# Patient Record
Sex: Female | Born: 1965 | Race: Black or African American | Hispanic: No | Marital: Married | Smoking: Never smoker
Health system: Southern US, Community
[De-identification: ages and names within clinical notes are randomized; demographics above are authoritative.]

## PROBLEM LIST (undated history)

## (undated) DIAGNOSIS — M1990 Unspecified osteoarthritis, unspecified site: Secondary | ICD-10-CM

## (undated) DIAGNOSIS — J45909 Unspecified asthma, uncomplicated: Secondary | ICD-10-CM

## (undated) DIAGNOSIS — E119 Type 2 diabetes mellitus without complications: Secondary | ICD-10-CM

## (undated) DIAGNOSIS — F319 Bipolar disorder, unspecified: Secondary | ICD-10-CM

## (undated) HISTORY — PX: HAND SURGERY: SHX662

## (undated) HISTORY — PX: COMBINED HYSTEROSCOPY DIAGNOSTIC / D&C: SUR297

## (undated) HISTORY — PX: ABDOMINAL HYSTERECTOMY: SHX81

---

## 2012-03-14 ENCOUNTER — Encounter (HOSPITAL_BASED_OUTPATIENT_CLINIC_OR_DEPARTMENT_OTHER): Payer: Self-pay

## 2012-03-14 ENCOUNTER — Emergency Department (HOSPITAL_BASED_OUTPATIENT_CLINIC_OR_DEPARTMENT_OTHER): Payer: Medicare Other

## 2012-03-14 ENCOUNTER — Emergency Department (HOSPITAL_BASED_OUTPATIENT_CLINIC_OR_DEPARTMENT_OTHER)
Admission: EM | Admit: 2012-03-14 | Discharge: 2012-03-14 | Disposition: A | Discharge status: Home / Self Care | Payer: Medicare Other | Attending: Emergency Medicine | Admitting: Emergency Medicine

## 2012-03-14 DIAGNOSIS — R51 Headache: Secondary | ICD-10-CM | POA: Insufficient documentation

## 2012-03-14 DIAGNOSIS — F319 Bipolar disorder, unspecified: Secondary | ICD-10-CM | POA: Insufficient documentation

## 2012-03-14 DIAGNOSIS — M129 Arthropathy, unspecified: Secondary | ICD-10-CM | POA: Insufficient documentation

## 2012-03-14 DIAGNOSIS — Z79899 Other long term (current) drug therapy: Secondary | ICD-10-CM | POA: Insufficient documentation

## 2012-03-14 DIAGNOSIS — E119 Type 2 diabetes mellitus without complications: Secondary | ICD-10-CM | POA: Insufficient documentation

## 2012-03-14 DIAGNOSIS — J329 Chronic sinusitis, unspecified: Secondary | ICD-10-CM | POA: Insufficient documentation

## 2012-03-14 DIAGNOSIS — J45909 Unspecified asthma, uncomplicated: Secondary | ICD-10-CM | POA: Insufficient documentation

## 2012-03-14 DIAGNOSIS — R110 Nausea: Secondary | ICD-10-CM | POA: Insufficient documentation

## 2012-03-14 HISTORY — DX: Bipolar disorder, unspecified: F31.9

## 2012-03-14 HISTORY — DX: Type 2 diabetes mellitus without complications: E11.9

## 2012-03-14 HISTORY — DX: Unspecified osteoarthritis, unspecified site: M19.90

## 2012-03-14 HISTORY — DX: Unspecified asthma, uncomplicated: J45.909

## 2012-03-14 LAB — URINALYSIS, ROUTINE W REFLEX MICROSCOPIC
Bilirubin Urine: NEGATIVE
Ketones, ur: NEGATIVE mg/dL
Leukocytes, UA: NEGATIVE
Nitrite: POSITIVE — AB
Protein, ur: NEGATIVE mg/dL
pH: 5 (ref 5.0–8.0)

## 2012-03-14 LAB — BASIC METABOLIC PANEL
CO2: 25 mEq/L (ref 19–32)
Calcium: 9.8 mg/dL (ref 8.4–10.5)
Creatinine, Ser: 0.7 mg/dL (ref 0.50–1.10)
GFR calc Af Amer: >90 mL/min (ref >90)
GFR calc non Af Amer: >90 mL/min (ref >90)
Sodium: 139 mEq/L (ref 135–145)

## 2012-03-14 LAB — CBC WITH DIFFERENTIAL/PLATELET
Basophils Absolute: 0 10*3/uL (ref 0.0–0.1)
Basophils Relative: 0 % (ref 0–1)
Eosinophils Absolute: 0.1 10*3/uL (ref 0.0–0.7)
Eosinophils Relative: 1 % (ref 0–5)
HCT: 37.9 % (ref 36.0–46.0)
MCH: 29.3 pg (ref 26.0–34.0)
MCHC: 35.4 g/dL (ref 30.0–36.0)
MCV: 82.9 fL (ref 78.0–100.0)
Monocytes Absolute: 0.6 10*3/uL (ref 0.1–1.0)
Platelets: 268 10*3/uL (ref 150–400)
RDW: 12.2 % (ref 11.5–15.5)
WBC: 8.5 10*3/uL (ref 4.0–10.5)

## 2012-03-14 LAB — LITHIUM LEVEL: Lithium Lvl: <0.25 mEq/L — ABNORMAL LOW (ref 0.80–1.40)

## 2012-03-14 LAB — URINE MICROSCOPIC-ADD ON

## 2012-03-14 MED ORDER — AMOXICILLIN-POT CLAVULANATE 875-125 MG PO TABS: 1.0000 | ORAL_TABLET | Freq: Two times a day (BID) | ORAL | Status: DC

## 2012-03-14 MED ORDER — METOCLOPRAMIDE HCL 5 MG/ML IJ SOLN
10.0000 mg | Freq: Once | INTRAMUSCULAR | Status: AC
  Administered 2012-03-14: 10 mg via INTRAVENOUS
  Filled 2012-03-14: qty 2

## 2012-03-14 MED ORDER — KETOROLAC TROMETHAMINE 30 MG/ML IJ SOLN
30.0000 mg | Freq: Once | INTRAMUSCULAR | Status: AC
  Administered 2012-03-14: 30 mg via INTRAVENOUS
  Filled 2012-03-14: qty 1

## 2012-03-14 MED ORDER — SODIUM CHLORIDE 0.9 % IV BOLUS (SEPSIS)
1000.0000 mL | Freq: Once | INTRAVENOUS | Status: AC
  Administered 2012-03-14: 1000 mL via INTRAVENOUS

## 2012-03-14 NOTE — ED Notes (Signed)
Headache x 2 weeks unrelieved after taking Hydrocodone.

## 2012-03-14 NOTE — ED Notes (Signed)
MD at bedside. 

## 2012-03-14 NOTE — ED Notes (Signed)
Pt is unable to void at present time. 

## 2012-03-14 NOTE — ED Notes (Signed)
Pt reports she received a steroid injection from PMD without relief.

## 2012-03-14 NOTE — ED Provider Notes (Signed)
History     CSN: [id number]  Arrival date & time 03/14/12  1324   First MD Initiated Contact with Patient 03/14/12 1027      Chief Complaint  Patient presents with  . Headache    (Consider location/radiation/quality/duration/timing/severity/associated sxs/prior treatment) HPI Pt reports she has had about 2 weeks of persistent, severe frontal headache, associated with nausea, no photophobia. No prior history of similar. She has been seen by her PCP for same twice since onset and felt to have a viral sinusitis. Given pain medications and decongestants without improvement. She denies any weakness, numbness, facial droop or blurry vision. Also having some aching in neck at times.   Past Medical History  Diagnosis Date  . Arthritis   . Asthma   . Diabetes mellitus without complication   . Bipolar 1 disorder     Past Surgical History  Procedure Date  . Cesarean section   . Combined hysteroscopy diagnostic / d&c   . Abdominal hysterectomy   . Hand surgery     No family history on file.  History  Substance Use Topics  . Smoking status: Never Smoker   . Smokeless tobacco: Never Used  . Alcohol Use: No    OB History    Grav Para Term Preterm Abortions TAB SAB Ect Mult Living                  Review of Systems All other systems reviewed and are negative except as noted in HPI.   Allergies  Sulfa antibiotics  Home Medications   Current Outpatient Rx  Name Route Sig Dispense Refill  . DICYCLOMINE HCL 10 MG PO CAPS Oral Take 10 mg by mouth 2 (two) times daily as needed.    Marland Kitchen HYDROCODONE-ACETAMINOPHEN 10-500 MG PO TABS Oral Take 1 tablet by mouth every 6 (six) hours as needed.    . IBUPROFEN 800 MG PO TABS Oral Take 800 mg by mouth every 8 (eight) hours as needed.    Marland Kitchen RISPERIDONE 1 MG PO TABS Oral Take 1 mg by mouth at bedtime.    Marland Kitchen LITHIUM CARBONATE 300 MG PO TABS Oral Take 300 mg by mouth.      BP 137/93  Pulse 110  Temp 99.8 F (37.7 C) (Oral)  Resp 18  Ht  5\' 5"  (1.651 m)  Wt 231 lb (104.781 kg)  BMI 38.44 kg/m2  SpO2 98%  Physical Exam  Nursing note and vitals reviewed. Constitutional: She is oriented to person, place, and time. She appears well-developed and well-nourished.  HENT:  Head: Normocephalic and atraumatic.  Eyes: EOM are normal. Pupils are equal, round, and reactive to light.  Neck: Normal range of motion. Neck supple.  Cardiovascular: Normal rate, normal heart sounds and intact distal pulses.   Pulmonary/Chest: Effort normal and breath sounds normal.  Abdominal: Bowel sounds are normal. She exhibits no distension. There is no tenderness.  Musculoskeletal: Normal range of motion. She exhibits no edema and no tenderness.  Neurological: She is alert and oriented to person, place, and time. She has normal strength. No cranial nerve deficit or sensory deficit.  Skin: Skin is warm and dry. No rash noted.  Psychiatric: She has a normal mood and affect.    ED Course  Procedures (including critical care time)  Labs Reviewed  CBC WITH DIFFERENTIAL - Abnormal; Notable for the following:    Lymphocytes Relative 47 (*)     All other components within normal limits  BASIC METABOLIC PANEL - Abnormal; Notable  for the following:    Glucose, Bld 124 (*)     All other components within normal limits  URINALYSIS, ROUTINE W REFLEX MICROSCOPIC - Abnormal; Notable for the following:    APPearance CLOUDY (*)     Glucose, UA 500 (*)     Nitrite POSITIVE (*)     All other components within normal limits  URINE MICROSCOPIC-ADD ON - Abnormal; Notable for the following:    Bacteria, UA MANY (*)     All other components within normal limits  LITHIUM LEVEL   Ct Head Wo Contrast  03/14/2012  *RADIOLOGY REPORT*  Clinical Data:  Headaches and facial pain is over last 2 weeks.  CT HEAD WITHOUT CONTRAST CT MAXILLOFACIAL WITHOUT CONTRAST  Technique:  Multidetector CT imaging of the head and maxillofacial structures were performed using the standard  protocol without intravenous contrast. Multiplanar CT image reconstructions of the maxillofacial structures were also generated.  Comparison:  None available.  CT HEAD  Findings: No acute cortical infarct, hemorrhage, or mass lesion is present.  The ventricles are normal size.  No significant extra- axial fluid collection is present.  Incidental note is made of prominent hiatal foramina bilaterally.  The right frontal sinus is opacified with chronic wall thickening.  A fluid level is present. The left frontal sinus is small.  The remaining paranasal sinuses and the mastoid air cells are clear.  The osseous skull is intact.  IMPRESSION:  1.  Normal CT appearance of the brain. 2.  Right frontal sinus disease. 3.  Prominent parietal foramina, a normal variant.  CT MAXILLOFACIAL  Findings:    The right frontal sinus is opacified.  A fluid level is present.  There is some wall thickening, suggesting an element of chronic disease.  The left frontal sinus is hypoplastic.  The paranasal sinuses are otherwise clear.  No acute bone or soft tissue abnormality is present in the face otherwise.  The mandible is intact and located.  Imaging of the upper cervical spine is within normal limits.  IMPRESSION:  1.  Right frontal sinus disease. 2.  No other acute or focal abnormality to explain the patient's symptoms.   Original Report Authenticated By: Jamesetta Orleans. MATTERN, M.D.    Ct Maxillofacial Wo Cm  03/14/2012  *RADIOLOGY REPORT*  Clinical Data:  Headaches and facial pain is over last 2 weeks.  CT HEAD WITHOUT CONTRAST CT MAXILLOFACIAL WITHOUT CONTRAST  Technique:  Multidetector CT imaging of the head and maxillofacial structures were performed using the standard protocol without intravenous contrast. Multiplanar CT image reconstructions of the maxillofacial structures were also generated.  Comparison:  None available.  CT HEAD  Findings: No acute cortical infarct, hemorrhage, or mass lesion is present.  The ventricles are  normal size.  No significant extra- axial fluid collection is present.  Incidental note is made of prominent hiatal foramina bilaterally.  The right frontal sinus is opacified with chronic wall thickening.  A fluid level is present. The left frontal sinus is small.  The remaining paranasal sinuses and the mastoid air cells are clear.  The osseous skull is intact.  IMPRESSION:  1.  Normal CT appearance of the brain. 2.  Right frontal sinus disease. 3.  Prominent parietal foramina, a normal variant.  CT MAXILLOFACIAL  Findings:    The right frontal sinus is opacified.  A fluid level is present.  There is some wall thickening, suggesting an element of chronic disease.  The left frontal sinus is hypoplastic.  The paranasal  sinuses are otherwise clear.  No acute bone or soft tissue abnormality is present in the face otherwise.  The mandible is intact and located.  Imaging of the upper cervical spine is within normal limits.  IMPRESSION:  1.  Right frontal sinus disease. 2.  No other acute or focal abnormality to explain the patient's symptoms.   Original Report Authenticated By: Jamesetta Orleans. MATTERN, M.D.      No diagnosis found.    MDM  CT as above shows sinusitis, otherwise normal. Lithium level is pending (sent out to [facility]) but patient states she only takes it 'as needed' and hasn't been taking it recently. Will treat for sinusitis. Advised decongestants as well and PCP followup.        Liberty Stead B. Bernette Mayers, MD 03/14/12 1246

## 2012-10-26 ENCOUNTER — Encounter (HOSPITAL_BASED_OUTPATIENT_CLINIC_OR_DEPARTMENT_OTHER): Payer: Self-pay | Admitting: *Deleted

## 2012-10-26 ENCOUNTER — Emergency Department (HOSPITAL_BASED_OUTPATIENT_CLINIC_OR_DEPARTMENT_OTHER)
Admission: EM | Admit: 2012-10-26 | Discharge: 2012-10-26 | Disposition: A | Discharge status: Home / Self Care | Payer: Medicare Other | Attending: Emergency Medicine | Admitting: Emergency Medicine

## 2012-10-26 DIAGNOSIS — M25569 Pain in unspecified knee: Secondary | ICD-10-CM | POA: Insufficient documentation

## 2012-10-26 DIAGNOSIS — Z79899 Other long term (current) drug therapy: Secondary | ICD-10-CM | POA: Insufficient documentation

## 2012-10-26 DIAGNOSIS — M129 Arthropathy, unspecified: Secondary | ICD-10-CM | POA: Insufficient documentation

## 2012-10-26 DIAGNOSIS — E119 Type 2 diabetes mellitus without complications: Secondary | ICD-10-CM | POA: Insufficient documentation

## 2012-10-26 DIAGNOSIS — F319 Bipolar disorder, unspecified: Secondary | ICD-10-CM | POA: Insufficient documentation

## 2012-10-26 DIAGNOSIS — M549 Dorsalgia, unspecified: Secondary | ICD-10-CM

## 2012-10-26 DIAGNOSIS — Z87828 Personal history of other (healed) physical injury and trauma: Secondary | ICD-10-CM | POA: Insufficient documentation

## 2012-10-26 DIAGNOSIS — J45909 Unspecified asthma, uncomplicated: Secondary | ICD-10-CM | POA: Insufficient documentation

## 2012-10-26 DIAGNOSIS — M25561 Pain in right knee: Secondary | ICD-10-CM

## 2012-10-26 MED ORDER — ETODOLAC 300 MG PO CAPS: 300.0000 mg | ORAL_CAPSULE | Freq: Three times a day (TID) | ORAL | Status: AC

## 2012-10-26 NOTE — ED Provider Notes (Signed)
History  This chart was scribed for Celene Kras, MD by Greggory Stallion, ED Scribe. This patient was seen in room MH02/MH02 and the patient's care was started at 4:05 PM.  CSN: [id number]  Arrival date & time 10/26/12  1544    Chief Complaint  Patient presents with  . Knee Pain    The history is provided by the patient. No language interpreter was used.    HPI Comments: Karen Carter is a 47 y.o. female who presents to the Emergency Department complaining of  constant right knee pain with associated back pain that started 3 weeks ago. She states the pain has gotten worse since yesterday. Pt states she was previously in the ED and was told it is arthritis. Pt states she is taking hydrocodone with no relief. Pt denies fever, neck pain, sore throat, visual disturbance, CP, cough, SOB, abdominal pain, nausea, emesis, diarrhea, urinary symptoms, HA, weakness, numbness and rash as associated symptoms. 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 Previously she had MRIs of her spine. She denies any recent falls or injury. Patient has not been seeing an orthopedic Dr. At her recent ER visit she did have x-rays that were reportedly normal.  Past Medical History  Diagnosis Date  . Arthritis   . Asthma   . Diabetes mellitus without complication   . Bipolar 1 disorder     Past Surgical History  Procedure Laterality Date  . Cesarean section    . Combined hysteroscopy diagnostic / d&c    . Abdominal hysterectomy    . Hand surgery      History reviewed. No pertinent family history.  History  Substance Use Topics  . Smoking status: Never Smoker   . Smokeless tobacco: Never Used  . Alcohol Use: No    OB History   Grav Para Term Preterm Abortions TAB SAB Ect Mult Living                  Review of Systems  All other systems reviewed and are negative.    A complete 10 system review of systems was obtained and all systems are negative except as  noted in the HPI and PMH.   Allergies  Sulfa antibiotics  Home Medications   Current Outpatient Rx  Name  Route  Sig  Dispense  Refill  . amoxicillin-clavulanate (AUGMENTIN) 875-125 MG per tablet   Oral   Take 1 tablet by mouth every 12 (twelve) hours.   14 tablet   0   . dicyclomine (BENTYL) 10 MG capsule   Oral   Take 10 mg by mouth 2 (two) times daily as needed.         . etodolac (LODINE) 300 MG capsule   Oral   Take 1 capsule (300 mg total) by mouth every 8 (eight) hours.   21 capsule   0     As needed for pain   . HYDROcodone-acetaminophen (LORTAB) 10-500 MG per tablet   Oral   Take 1 tablet by mouth every 6 (six) hours as needed.         . lithium 300 MG tablet   Oral   Take 300 mg by mouth.         . risperiDONE (RISPERDAL) 1 MG tablet   Oral   Take 1 mg by mouth at bedtime.           BP 150/94  Pulse 106  Temp(Src) 99 F (37.2 C) (  Oral)  Resp 20  Ht 5' 5.5" (1.664 m)  Wt 230 lb (104.327 kg)  BMI 37.68 kg/m2  SpO2 96%  Physical Exam  Nursing note and vitals reviewed. Constitutional: She appears well-developed and well-nourished. No distress.  HENT:  Head: Normocephalic and atraumatic.  Right Ear: External ear normal.  Left Ear: External ear normal.  Eyes: Conjunctivae are normal. Right eye exhibits no discharge. Left eye exhibits no discharge. No scleral icterus.  Neck: Neck supple. No tracheal deviation present.  Cardiovascular: Normal rate.   Pulmonary/Chest: Effort normal. No stridor. No respiratory distress.  Musculoskeletal: She exhibits no edema.  No erythema, no effusion, no edema to right knee. Mild tenderness to right knee. No erythema, no ttp to  lumbar spine.   Neurological: She is alert. Cranial nerve deficit: no gross deficits.  Skin: Skin is warm and dry. No rash noted.  Psychiatric: She has a normal mood and affect.    ED Course  Procedures (including critical care time)  DIAGNOSTIC STUDIES: Oxygen Saturation is  96% on RA, normal by my interpretation.    COORDINATION OF CARE: 4:15 PM-Discussed treatment plan with pt at bedside and pt agreed to plan.   Labs Reviewed - No data to display No results found.   1. Back pain   2. Knee pain, right       MDM  At this time there does not appear to be any evidence of an acute emergency medical condition and the patient appears stable for discharge with appropriate outpatient follow up.  Will refer to ortho      I personally performed the services described in this documentation, which was scribed in my presence. The recorded information has been reviewed and is accurate.   Celene Kras, MD 10/26/12 [phone]

## 2012-10-26 NOTE — ED Notes (Signed)
Pt staets she has had right knee pain x 3 weeks "arthritis". Had Cortisone injection 4 days ago. Worse since yesterday.

## 2015-09-29 ENCOUNTER — Emergency Department (HOSPITAL_BASED_OUTPATIENT_CLINIC_OR_DEPARTMENT_OTHER)
Admission: EM | Admit: 2015-09-29 | Discharge: 2015-09-29 | Disposition: A | Discharge status: Home / Self Care | Payer: Medicare Other | Attending: Emergency Medicine | Admitting: Emergency Medicine

## 2015-09-29 ENCOUNTER — Encounter (HOSPITAL_BASED_OUTPATIENT_CLINIC_OR_DEPARTMENT_OTHER): Payer: Self-pay | Admitting: *Deleted

## 2015-09-29 DIAGNOSIS — E119 Type 2 diabetes mellitus without complications: Secondary | ICD-10-CM | POA: Insufficient documentation

## 2015-09-29 DIAGNOSIS — Z794 Long term (current) use of insulin: Secondary | ICD-10-CM | POA: Insufficient documentation

## 2015-09-29 DIAGNOSIS — J45909 Unspecified asthma, uncomplicated: Secondary | ICD-10-CM | POA: Insufficient documentation

## 2015-09-29 DIAGNOSIS — J018 Other acute sinusitis: Secondary | ICD-10-CM | POA: Diagnosis not present

## 2015-09-29 DIAGNOSIS — F319 Bipolar disorder, unspecified: Secondary | ICD-10-CM | POA: Insufficient documentation

## 2015-09-29 DIAGNOSIS — Z79899 Other long term (current) drug therapy: Secondary | ICD-10-CM | POA: Diagnosis not present

## 2015-09-29 DIAGNOSIS — R51 Headache: Secondary | ICD-10-CM | POA: Diagnosis present

## 2015-09-29 MED ORDER — AMOXICILLIN-POT CLAVULANATE 875-125 MG PO TABS: 1.0000 | ORAL_TABLET | Freq: Two times a day (BID) | ORAL | Status: AC

## 2015-09-29 NOTE — ED Notes (Signed)
Pt c/o nasal congestion and facial pain x 2 weeks

## 2015-09-29 NOTE — ED Notes (Signed)
PA at bedside.

## 2015-09-29 NOTE — Discharge Instructions (Signed)
Take all of your antibiotic as prescribed. Do not save or share them. Follow-up with your doctor. Return to ED for any new or worsening symptoms as we discussed.  Sinusitis, Adult Sinusitis is redness, soreness, and puffiness (inflammation) of the air pockets in the bones of your face (sinuses). The redness, soreness, and puffiness can cause air and mucus to get trapped in your sinuses. This can allow germs to grow and cause an infection.  HOME CARE   Drink enough fluids to keep your pee (urine) clear or pale yellow.  Use a humidifier in your home.  Run a hot shower to create steam in the bathroom. Sit in the bathroom with the door closed. Breathe in the steam 3-4 times a day.  Put a warm, moist washcloth on your face 3-4 times a day, or as told by your doctor.  Use salt water sprays (saline sprays) to wet the thick fluid in your nose. This can help the sinuses drain.  Only take medicine as told by your doctor. GET HELP RIGHT AWAY IF:   Your pain gets worse.  You have very bad headaches.  You are sick to your stomach (nauseous).  You throw up (vomit).  You are very sleepy (drowsy) all the time.  Your face is puffy (swollen).  Your vision changes.  You have a stiff neck.  You have trouble breathing. MAKE SURE YOU:   Understand these instructions.  Will watch your condition.  Will get help right away if you are not doing well or get worse.   This information is not intended to replace advice given to you by your health care provider. Make sure you discuss any questions you have with your health care provider.   Document Released: 11/07/2007 Document Revised: 06/11/2014 Document Reviewed: 12/25/2011 Elsevier Interactive Patient Education [employer]2016 [employer]vier Inc.

## 2015-09-29 NOTE — ED Provider Notes (Signed)
CSN: [id number]     Arrival date & time 09/29/15  1955 History  By signing my name below, I, Bethel Born, attest that this documentation has been prepared under the direction and in the presence of [employer]. Electronically Signed: Bethel Born, ED Scribe. 09/29/2015 8:51 PM   Chief Complaint  Patient presents with  . Facial Pain   The history is provided by the patient. No language interpreter was used.   Karen Carter is a 50 y.o. female who presents to the Emergency Department complaining of Frontal facial pain with onset 2 weeks ago. Complains of clear to yellow nasal discharge. Has tried Mucinex, Benadryl and Claritin with mild relief. She reports a period After a week or so where she felt like she was getting better, but then symptoms returned. Associated symptoms include sinus pressure. Pt denies fevers, chills, sore throat, neck pain or stiffness.   Past Medical History  Diagnosis Date  . Arthritis   . Asthma   . Diabetes mellitus without complication (HCC)   . Bipolar 1 disorder [facility])    Past Surgical History  Procedure Laterality Date  . Cesarean section    . Combined hysteroscopy diagnostic / d&c    . Abdominal hysterectomy    . Hand surgery     History reviewed. No pertinent family history. Social History  Substance Use Topics  . Smoking status: Never Smoker   . Smokeless tobacco: Never Used  . Alcohol Use: No   OB History    No data available     Review of Systems 10 Systems reviewed and all are negative for acute change except as noted in the HPI.   Allergies  Sulfa antibiotics  Home Medications   Prior to Admission medications   Medication Sig Start Date End Date Taking? Authorizing Provider  Exenatide ER (BYDUREON) 2 MG PEN Inject into the skin.   Yes Historical Provider, MD  insulin aspart (NOVOLOG) 100 UNIT/ML injection Inject into the skin 3 (three) times daily before meals.   Yes Historical Provider, MD  Insulin Detemir  (LEVEMIR [place]) Inject into the skin.   Yes Historical Provider, MD  metFORMIN (GLUCOPHAGE) 1000 MG tablet Take 1,000 mg by mouth 2 (two) times daily with a meal.   Yes Historical Provider, MD  amoxicillin-clavulanate (AUGMENTIN) 875-125 MG tablet Take 1 tablet by mouth every 12 (twelve) hours. 09/29/15   Joycie Peek, PA-C  dicyclomine (BENTYL) 10 MG capsule Take 10 mg by mouth 2 (two) times daily as needed.    Historical Provider, MD  etodolac (LODINE) 300 MG capsule Take 1 capsule (300 mg total) by mouth every 8 (eight) hours. 10/26/12   Linwood Dibbles, MD  HYDROcodone-acetaminophen (LORTAB) 10-500 MG per tablet Take 1 tablet by mouth every 6 (six) hours as needed.    Historical Provider, MD  lithium 300 MG tablet Take 300 mg by mouth.    Historical Provider, MD  risperiDONE (RISPERDAL) 1 MG tablet Take 1 mg by mouth at bedtime.    Historical Provider, MD   BP 108/80 mmHg  Pulse 110  Temp(Src) 98.9 F (37.2 C) (Oral)  Resp 16  Ht  (1.651 m)  Wt 222 lb (100.699 kg)  BMI 36.94 kg/m2  SpO2 99% Physical Exam  Constitutional: She is oriented to person, place, and time. She appears well-developed and well-nourished. No distress.  Awake, alert, nontoxic appearance.  HENT:  Head: Normocephalic and atraumatic.  Diffuse maxillary sinus and frontal sinus pain with palpation. No erythema or  other lesions.  Eyes: Conjunctivae and EOM are normal. Right eye exhibits no discharge. Left eye exhibits no discharge.  Neck: Neck supple. No tracheal deviation present.  No meningismus or nuchal rigidity  Cardiovascular: Normal rate.   Pulmonary/Chest: Effort normal. No respiratory distress. She exhibits no tenderness.  Abdominal: Soft. There is no tenderness. There is no rebound.  Musculoskeletal: Normal range of motion. She exhibits no tenderness.  Baseline ROM, no obvious new focal weakness.  Neurological: She is alert and oriented to person, place, and time.  Mental status and motor strength appears  baseline for patient and situation.  Skin: Skin is warm and dry. No rash noted.  Psychiatric: She has a normal mood and affect. Her behavior is normal.  Nursing note and vitals reviewed.   ED Course  Procedures (including critical care time) DIAGNOSTIC STUDIES: Oxygen Saturation is 99% on RA,  normal by my interpretation.    COORDINATION OF CARE: 8:51 PM Discussed treatment plan which includes abx therapy with pt at bedside and pt agreed to plan.  Labs Review Labs Reviewed - No data to display  Imaging Review No results found.    EKG Interpretation None     Meds given in ED:  Medications - No data to display  Discharge Medication List as of 09/29/2015  9:12 PM    START taking these medications   Details  amoxicillin-clavulanate (AUGMENTIN) 875-125 MG tablet Take 1 tablet by mouth every 12 (twelve) hours., Starting 09/29/2015, Until Discontinued, Print        MDM  Patient complaining of symptoms of sinusitis.   Mild to moderate symptoms of clear/yellow nasal discharge/congestion for the past 2 weeks.  Patient is afebrile.  We'll treat for bacterial sinusitis.  Patient discharged with Augmentin and further symptomatic treatment.  Patient instructions given for warm saline nasal washes.  Recommendations for follow-up with primary care physician.    Final diagnoses:  Other acute sinusitis    I personally performed the services described in this documentation, which was scribed in my presence. The recorded information has been reviewed and is accurate.    Joycie PeekBenjamin Aliegha Paullin, PA-C 09/30/15 0034  Tilden FossaElizabeth Rees, MD 10/01/15 [phone]

## 2017-02-14 ENCOUNTER — Encounter (HOSPITAL_BASED_OUTPATIENT_CLINIC_OR_DEPARTMENT_OTHER): Payer: Self-pay | Admitting: *Deleted

## 2017-02-14 ENCOUNTER — Emergency Department (HOSPITAL_BASED_OUTPATIENT_CLINIC_OR_DEPARTMENT_OTHER)
Admission: EM | Admit: 2017-02-14 | Discharge: 2017-02-14 | Disposition: A | Discharge status: Home / Self Care | Payer: Medicare HMO | Attending: Emergency Medicine | Admitting: Emergency Medicine

## 2017-02-14 DIAGNOSIS — J45909 Unspecified asthma, uncomplicated: Secondary | ICD-10-CM | POA: Diagnosis not present

## 2017-02-14 DIAGNOSIS — J019 Acute sinusitis, unspecified: Secondary | ICD-10-CM | POA: Insufficient documentation

## 2017-02-14 DIAGNOSIS — Z794 Long term (current) use of insulin: Secondary | ICD-10-CM | POA: Diagnosis not present

## 2017-02-14 DIAGNOSIS — E119 Type 2 diabetes mellitus without complications: Secondary | ICD-10-CM | POA: Insufficient documentation

## 2017-02-14 DIAGNOSIS — G501 Atypical facial pain: Secondary | ICD-10-CM | POA: Diagnosis present

## 2017-02-14 MED ORDER — AZITHROMYCIN 250 MG PO TABS: 250.0000 mg | ORAL_TABLET | Freq: Every day | ORAL | 0 refills | Status: DC

## 2017-02-14 MED FILL — AZITHROMYCIN 250 MG TABLET: 250 | 5 days supply | Qty: 6 | Fill #0

## 2017-02-14 NOTE — ED Notes (Signed)
Pt directed to pharmacy to pick up Rx 

## 2017-02-14 NOTE — Discharge Instructions (Signed)
Follow-up with your primary care provider to have referral to an ear nose and throat doctor if you continue to have sinus problems.

## 2017-02-14 NOTE — ED Provider Notes (Signed)
[facility] Provider Note   CSN: [id number] Arrival date & time: 02/14/17  0836     History   Chief Complaint Chief Complaint  Patient presents with  . Facial Pain    HPI Karen Carter is a 51 y.o. female.  51yo F w/ PMH including asthma, T2DM, bipolar d/o who p/w facial pain. 1 week ago she had a cold illness involving cough, nasal congestion and URI sx. and those symptoms are getting better but her nasal congestion and postnasal drip has persisted and now worsened. She has had thick mucus from her nose, some mild blood-tinge when blowing her nose. Today she has had worsening frontal headache and facial pain. She has had this problem several times in the past related to sinus infections and this feels the same. She denies any fevers, vomiting, or other associated symptoms. She has been taking over-the-counter decongestants with mild relief.   The history is provided by the patient.    Past Medical History:  Diagnosis Date  . Arthritis   . Asthma   . Bipolar 1 disorder (HCC)   . Diabetes mellitus without complication (HCC)     There are no active problems to display for this patient.   Past Surgical History:  Procedure Laterality Date  . ABDOMINAL HYSTERECTOMY    . CESAREAN SECTION    . COMBINED HYSTEROSCOPY DIAGNOSTIC / D&C    . HAND SURGERY      OB History    No data available       Home Medications    Prior to Admission medications   Medication Sig Start Date End Date Taking? Authorizing Provider  ATORVASTATIN CALCIUM PO Take by mouth.   Yes [provider]  Exenatide ER (BYDUREON) 2 MG PEN Inject into the skin.   Yes [provider]  insulin aspart (NOVOLOG) 100 UNIT/ML injection Inject into the skin 3 (three) times daily before meals.   Yes [provider]  Insulin Detemir (LEVEMIR [place]) Inject into the skin.   Yes [provider]  lisinopril (PRINIVIL,ZESTRIL) 10 MG tablet Take 10 mg by mouth daily.    Yes [provider]  lithium 300 MG tablet Take 300 mg by mouth.   Yes [provider]  metFORMIN (GLUCOPHAGE) 1000 MG tablet Take 1,000 mg by mouth 2 (two) times daily with a meal.   Yes [provider]  risperiDONE (RISPERDAL) 1 MG tablet Take 1 mg by mouth at bedtime.   Yes [provider]  amoxicillin-clavulanate (AUGMENTIN) 875-125 MG tablet Take 1 tablet by mouth every 12 (twelve) hours. 09/29/15   Cartner, Sharlet Salina, PA-C  azithromycin (ZITHROMAX) 250 MG tablet Take 1 tablet (250 mg total) by mouth daily. Take first 2 tablets together, then 1 every day until finished. 02/14/17   Ivelisse Culverhouse, Ambrose Finland, MD  dicyclomine (BENTYL) 10 MG capsule Take 10 mg by mouth 2 (two) times daily as needed.    [provider]  etodolac (LODINE) 300 MG capsule Take 1 capsule (300 mg total) by mouth every 8 (eight) hours. 10/26/12   Linwood Dibbles, MD  HYDROcodone-acetaminophen (LORTAB) 10-500 MG per tablet Take 1 tablet by mouth every 6 (six) hours as needed.    [provider]    Family History No family history on file.  Social History Social History  Substance Use Topics  . Smoking status: Never Smoker  . Smokeless tobacco: Never Used  . Alcohol use No     Allergies   Sulfa antibiotics  Review of Systems Review of Systems All other systems reviewed and are negative except that which was mentioned in HPI   Physical Exam Updated Vital Signs BP (!) 139/104 (BP Location: Left Arm)   Pulse 82   Temp 98.5 F (36.9 C) (Oral)   Resp 16   Ht 5\' 6"  (1.676 m)   Wt 100.7 kg (222 lb)   SpO2 99%   BMI 35.83 kg/m   Physical Exam  Constitutional: She is oriented to person, place, and time. She appears well-developed and well-nourished. No distress.  HENT:  Head: Normocephalic and atraumatic.  Right Ear: Tympanic membrane and ear canal normal.  Left Ear: Tympanic membrane and ear canal normal.  Mouth/Throat: Oropharynx is clear and moist. No  oropharyngeal exudate.  Moist mucous membranes Mild tenderness frontal sinus and b/l maxillary sinuses  Eyes: Pupils are equal, round, and reactive to light. Conjunctivae are normal.  Neck: Neck supple.  Cardiovascular: Normal rate, regular rhythm and normal heart sounds.   No murmur heard. Pulmonary/Chest: Effort normal and breath sounds normal.  Musculoskeletal: She exhibits no edema.  Lymphadenopathy:    She has no cervical adenopathy.  Neurological: She is alert and oriented to person, place, and time.  Fluent speech  Skin: Skin is warm and dry.  Psychiatric: She has a normal mood and affect. Judgment normal.  Nursing note and vitals reviewed.    ED Treatments / Results  Labs (all labs ordered are listed, but only abnormal results are displayed) Labs Reviewed - No data to display  EKG  EKG Interpretation None       Radiology No results found.  Procedures Procedures (including critical care time)  Medications Ordered in ED Medications - No data to display   Initial Impression / Assessment and Plan / ED Course  I have reviewed the triage vital signs and the nursing notes.     PT w/ facial pain, sinus headache 1 week after cold. Symptoms and exam are concerning for bacterial sinusitis given that all of her viral URI symptoms have almost resolved. Discussed risks and benefits of antibiotics and patient wanted to proceed with treatment. Instructed to follow-up with PCP, given her history of recurrent episodes of sinusitis she may eventually need referral to ENT. Return precautions reviewed.  Final Clinical Impressions(s) / ED Diagnoses   Final diagnoses:  Acute non-recurrent sinusitis, unspecified location    New Prescriptions New Prescriptions   AZITHROMYCIN (ZITHROMAX) 250 MG TABLET    Take 1 tablet (250 mg total) by mouth daily. Take first 2 tablets together, then 1 every day until finished.     Clydene Burack, Ambrose Finlandachel Morgan, MD 02/14/17 [phone]

## 2017-02-14 NOTE — ED Triage Notes (Signed)
Pt reports cold for over 1 week. States sx have resolved but she is now having facial pain and is concerned for sinus infection

## 2019-08-22 ENCOUNTER — Emergency Department (HOSPITAL_BASED_OUTPATIENT_CLINIC_OR_DEPARTMENT_OTHER): Payer: Medicare HMO

## 2019-08-22 ENCOUNTER — Other Ambulatory Visit: Payer: Self-pay

## 2019-08-22 ENCOUNTER — Emergency Department (HOSPITAL_BASED_OUTPATIENT_CLINIC_OR_DEPARTMENT_OTHER)
Admission: EM | Admit: 2019-08-22 | Discharge: 2019-08-22 | Disposition: A | Discharge status: Home / Self Care | Payer: Medicare HMO | Attending: Emergency Medicine | Admitting: Emergency Medicine

## 2019-08-22 ENCOUNTER — Encounter (HOSPITAL_BASED_OUTPATIENT_CLINIC_OR_DEPARTMENT_OTHER): Payer: Self-pay | Admitting: *Deleted

## 2019-08-22 DIAGNOSIS — E119 Type 2 diabetes mellitus without complications: Secondary | ICD-10-CM | POA: Diagnosis not present

## 2019-08-22 DIAGNOSIS — R0789 Other chest pain: Secondary | ICD-10-CM | POA: Diagnosis not present

## 2019-08-22 DIAGNOSIS — Z79899 Other long term (current) drug therapy: Secondary | ICD-10-CM | POA: Diagnosis not present

## 2019-08-22 DIAGNOSIS — Z20822 Contact with and (suspected) exposure to covid-19: Secondary | ICD-10-CM | POA: Diagnosis not present

## 2019-08-22 DIAGNOSIS — J45909 Unspecified asthma, uncomplicated: Secondary | ICD-10-CM | POA: Diagnosis not present

## 2019-08-22 DIAGNOSIS — Z794 Long term (current) use of insulin: Secondary | ICD-10-CM | POA: Diagnosis not present

## 2019-08-22 DIAGNOSIS — N644 Mastodynia: Secondary | ICD-10-CM | POA: Diagnosis present

## 2019-08-22 LAB — CBC WITH DIFFERENTIAL/PLATELET
Abs Immature Granulocytes: 0.03 10*3/uL (ref 0.00–0.07)
Basophils Absolute: 0 10*3/uL (ref 0.0–0.1)
Basophils Relative: 1 %
Eosinophils Absolute: 0.3 10*3/uL (ref 0.0–0.5)
Eosinophils Relative: 5 %
HCT: 39.5 % (ref 36.0–46.0)
Hemoglobin: 13.6 g/dL (ref 12.0–15.0)
Immature Granulocytes: 0 %
Lymphocytes Relative: 48 %
Lymphs Abs: 3.3 10*3/uL (ref 0.7–4.0)
MCH: 30 pg (ref 26.0–34.0)
MCHC: 34.4 g/dL (ref 30.0–36.0)
MCV: 87.2 fL (ref 80.0–100.0)
Monocytes Absolute: 0.5 10*3/uL (ref 0.1–1.0)
Monocytes Relative: 7 %
Neutro Abs: 2.7 10*3/uL (ref 1.7–7.7)
Neutrophils Relative %: 39 %
Platelets: 222 10*3/uL (ref 150–400)
RBC: 4.53 MIL/uL (ref 3.87–5.11)
RDW: 12.5 % (ref 11.5–15.5)
WBC: 6.9 10*3/uL (ref 4.0–10.5)
nRBC: 0 % (ref 0.0–0.2)

## 2019-08-22 LAB — BASIC METABOLIC PANEL
Anion gap: 9 (ref 5–15)
BUN: 9 mg/dL (ref 6–20)
CO2: 24 mmol/L (ref 22–32)
Calcium: 9.3 mg/dL (ref 8.9–10.3)
Chloride: 101 mmol/L (ref 98–111)
Creatinine, Ser: 0.73 mg/dL (ref 0.44–1.00)
GFR calc Af Amer: >60 mL/min (ref >60)
GFR calc non Af Amer: >60 mL/min (ref >60)
Glucose, Bld: 303 mg/dL — ABNORMAL HIGH (ref 70–99)
Potassium: 4.1 mmol/L (ref 3.5–5.1)
Sodium: 134 mmol/L — ABNORMAL LOW (ref 135–145)

## 2019-08-22 LAB — SARS CORONAVIRUS 2 AG (30 MIN TAT): SARS Coronavirus 2 Ag: NEGATIVE

## 2019-08-22 MED ORDER — ACETAMINOPHEN 325 MG PO TABS
650.0000 mg | ORAL_TABLET | Freq: Once | ORAL | Status: AC
  Administered 2019-08-22: 650 mg via ORAL
  Filled 2019-08-22: qty 2

## 2019-08-22 MED ORDER — METHOCARBAMOL 500 MG PO TABS: 500.0000 mg | ORAL_TABLET | Freq: Two times a day (BID) | ORAL | 0 refills | Status: AC

## 2019-08-22 MED ORDER — AZITHROMYCIN 250 MG PO TABS: 250.0000 mg | ORAL_TABLET | Freq: Every day | ORAL | 0 refills | Status: AC

## 2019-08-22 NOTE — ED Notes (Signed)
Redraw light green, left hand

## 2019-08-22 NOTE — Discharge Instructions (Signed)
Please call your PCP to schedule an appointment to be seen next week  Take the muscle relaxer as prescribed in addition to your usual pain  medication to see if this helps with your pain. DO NOT DRIVE WHILE ON THIS MEDICATION AS IT CAN MAKE YOU DROWSY.   If no improvement in symptoms and you continue to have a cough/fevers > 100.4 please pick up the antibiotic and take as prescribed however there were no signs of a pneumonia on your CT scan today   Your CT scan did show pulmonary nodules on both sides of your lungs. It is recommended that you get a repeat CT scan in 12 months for reevaluation. Your PCP can schedule this for you.   Return to the ED for any worsening symptoms including worsening pain, shortness of breath, nausea, vomiting, profuse sweating, continuation of fevers > 100.4 despite antibiotics.   You have declined repeat COVID test today. Please understand we cannot definitively rule out COVID without this additional test even though you are fully vaccinated.

## 2019-08-22 NOTE — ED Notes (Signed)
Pt discharged to home. Discharge instructions have been discussed with patient and/or family members. Pt verbally acknowledges understanding d/c instructions, and states understanding to checkout at registration before leaving. Pt declined d/c vitals. Pt wanted to leave

## 2019-08-22 NOTE — ED Provider Notes (Signed)
[facility] EMERGENCY DEPARTMENT Provider Note   CSN: [id number] Arrival date & time: 08/22/19  1519     History Chief Complaint  Patient presents with  . Breast Pain    Karen Carter is a 54 y.o. female.  HPI   Pt is a 54 y/o female with a h/o asthma, bipolar 1 disorder, DM, who presents to the ED today for eval of breast pain to the right breast pain and right rib pain that started 1 week ago after she hit her breast on a wooden plank and felt a pop. Pain feels like a cramping pain and has been constant since onset. Pain worsens with movement, breathing, and coughing. Pain improves at rest and with oxycodone.   She states she started having a cough yesterday. She is coughing up clear phlegm. States she has some associated sob but she is not sure if this is due to pain. She denies associated fevers, nausea, vomiting, abd pain, fevers, chest pain, diarrhea, or urinary sxs.   Past Medical History:  Diagnosis Date  . Arthritis   . Asthma   . Bipolar 1 disorder (HCC)   . Diabetes mellitus without complication (HCC)     There are no problems to display for this patient.   Past Surgical History:  Procedure Laterality Date  . ABDOMINAL HYSTERECTOMY    . CESAREAN SECTION    . COMBINED HYSTEROSCOPY DIAGNOSTIC / D&C    . HAND SURGERY       OB History   No obstetric history on file.     No family history on file.  Social History   Tobacco Use  . Smoking status: Never Smoker  . Smokeless tobacco: Never Used  Substance Use Topics  . Alcohol use: No  . Drug use: No    Home Medications Prior to Admission medications   Medication Sig Start Date End Date Taking? Authorizing Provider  Semaglutide (OZEMPIC, 1 MG/DOSE, [place]) Inject into the skin.   Yes [provider]  amoxicillin-clavulanate (AUGMENTIN) 875-125 MG tablet Take 1 tablet by mouth every 12 (twelve) hours. 09/29/15   Cartner, Sharlet Salina, PA-C  ATORVASTATIN CALCIUM PO Take by mouth.     [provider]  azithromycin (ZITHROMAX) 250 MG tablet Take 1 tablet (250 mg total) by mouth daily. Take first 2 tablets together, then 1 every day until finished. 02/14/17   Little, Ambrose Finland, MD  dicyclomine (BENTYL) 10 MG capsule Take 10 mg by mouth 2 (two) times daily as needed.    [provider]  etodolac (LODINE) 300 MG capsule Take 1 capsule (300 mg total) by mouth every 8 (eight) hours. 10/26/12   Linwood Dibbles, MD  Exenatide ER (BYDUREON) 2 MG PEN Inject into the skin.    [provider]  HYDROcodone-acetaminophen (LORTAB) 10-500 MG per tablet Take 1 tablet by mouth every 6 (six) hours as needed.    [provider]  insulin aspart (NOVOLOG) 100 UNIT/ML injection Inject into the skin 3 (three) times daily before meals.    [provider]  Insulin Detemir (LEVEMIR [place]) Inject into the skin.    [provider]  lisinopril (PRINIVIL,ZESTRIL) 10 MG tablet Take 10 mg by mouth daily.    [provider]  lithium 300 MG tablet Take 300 mg by mouth.    [provider]  metFORMIN (GLUCOPHAGE) 1000 MG tablet Take 1,000 mg by mouth 2 (two) times daily with a meal.    [provider]  risperiDONE (RISPERDAL) 1  MG tablet Take 1 mg by mouth at bedtime.    [provider]    Allergies    Hydrocodone-acetaminophen and Sulfa antibiotics  Review of Systems   Review of Systems  Constitutional: Negative for chills and fever.  HENT: Negative for ear pain and sore throat.   Eyes: Negative for pain and visual disturbance.  Respiratory: Positive for cough and shortness of breath.   Cardiovascular: Positive for chest pain.  Gastrointestinal: Negative for abdominal pain and vomiting.  Genitourinary: Negative for dysuria and hematuria.  Musculoskeletal: Negative for arthralgias and back pain.  Skin: Negative for color change and rash.  Neurological: Negative for headaches.  All other systems reviewed and are  negative.   Physical Exam Updated Vital Signs BP 108/90 (BP Location: Right Arm)   Pulse (!) 105   Temp 100.2 F (37.9 C) (Oral)   Resp 18   Ht 5\' 6"  (1.676 m)   Wt 100.7 kg   SpO2 100%   BMI 35.83 kg/m   Physical Exam Vitals and nursing note reviewed.  Constitutional:      General: She is not in acute distress.    Appearance: She is well-developed.  HENT:     Head: Normocephalic and atraumatic.  Eyes:     Conjunctiva/sclera: Conjunctivae normal.  Cardiovascular:     Rate and Rhythm: Normal rate and regular rhythm.     Pulses: Normal pulses.     Heart sounds: Normal heart sounds. No murmur.  Pulmonary:     Effort: Pulmonary effort is normal. No respiratory distress.     Breath sounds: Normal breath sounds. No wheezing, rhonchi or rales.  Chest:     Chest wall: Tenderness (TTP along the right chest wall that reproduces pain) present.     Comments: No breast TTP or skin changes.  Abdominal:     General: Bowel sounds are normal.     Palpations: Abdomen is soft.     Tenderness: There is no abdominal tenderness.  Musculoskeletal:     Cervical back: Neck supple.  Skin:    General: Skin is warm and dry.  Neurological:     Mental Status: She is alert.     ED Results / Procedures / Treatments   Labs (all labs ordered are listed, but only abnormal results are displayed) Labs Reviewed  SARS CORONAVIRUS 2 AG (30 MIN TAT)  CBC WITH DIFFERENTIAL/PLATELET  URINALYSIS, ROUTINE W REFLEX MICROSCOPIC    EKG None  Radiology DG Ribs Unilateral W/Chest Right  Result Date: 08/22/2019 CLINICAL DATA:  Anterior right-sided rib pain, status post trauma. EXAM: RIGHT RIBS AND CHEST - 3+ VIEW COMPARISON:  None. FINDINGS: No fracture or other bone lesions are seen involving the ribs. There is no evidence of pneumothorax or pleural effusion. Both lungs are clear. Heart size and mediastinal contours are within normal limits. Radiopaque surgical clips are seen overlying the right upper  quadrant. IMPRESSION: No acute osseous abnormality. Electronically Signed   By: Virgina Norfolk M.D.   On: 08/22/2019 16:29    Procedures Procedures (including critical care time)  Medications Ordered in ED Medications  acetaminophen (TYLENOL) tablet 650 mg (650 mg Oral Given 08/22/19 1647)    ED Course  I have reviewed the triage vital signs and the nursing notes.  Pertinent labs & imaging results that were available during my care of the patient were reviewed by me and considered in my medical decision making (see chart for details).    MDM Rules/Calculators/A&P  54 y/o F presenting with right chest wall pain after falling against a metal chair last week. Yesterday developed a cough and some sob.   She is borderline febrile and marginally tachycardic. Otherwise her vs are wnl.   Will check labs, covid, test, ua.  Reviewed/interpreted labs, CBC with no leukocytosis or anemia  CXR personally reviewed/interpeted - right ribs neg for fx, obvious pna, or ptx.   At shift change, remainder of labs and imaging are currently pending. Pt was signed out to Tanda Rockers, PA-C with plan to f/u on pending imaging.    Final Clinical Impression(s) / ED Diagnoses Final diagnoses:  Chest wall pain    Rx / DC Orders ED Discharge Orders    None       Rayne Du 08/22/19 1711    Jacalyn Lefevre, MD 08/22/19 2005

## 2019-08-22 NOTE — ED Triage Notes (Addendum)
Pt reports she bumped her right breast on the back of a wooden chair one week ago. States she felt something "pop". Reports she has been taking ibuprofen and oxycodone without relief. Pt states she had her second covid vaccine on Monday. Pt states she has pain with movement of her breast

## 2019-08-22 NOTE — ED Notes (Signed)
Pt ambulatory with steady gait to restroom 

## 2019-08-22 NOTE — ED Provider Notes (Signed)
Care assumed from [facility], [place], at shift change, please see their notes for full documentation of patient's complaint/HPI. Briefly, pt here with complaint of right breast pain/rib pain x 1 week after she hit her breast on a wooden plank and felt a pop; began having a cough yesterday. Results so far show no leukocytosis, Rapid COVID negative, and normal CXR without signs of rib fracture of PNA. Awaiting CT Chest. Plan is to dispo accordingly.   Physical Exam  BP (!) 132/92 (BP Location: Right Arm)   Pulse 86   Temp 100.2 F (37.9 C) (Oral)   Resp 18   Ht _0  (1.676 m)   Wt 100.7 kg   SpO2 100%   BMI 35.83 kg/m   Physical Exam Vitals and nursing note reviewed.  Constitutional:      Appearance: She is not ill-appearing.  HENT:     Head: Normocephalic and atraumatic.  Eyes:     Conjunctiva/sclera: Conjunctivae normal.  Cardiovascular:     Rate and Rhythm: Normal rate and regular rhythm.     Pulses: Normal pulses.  Pulmonary:     Effort: Pulmonary effort is normal.     Breath sounds: Normal breath sounds. No wheezing, rhonchi or rales.  Chest:     Chest wall: Tenderness present.     Comments: + Right chest wall tenderness directly underneath right breast along ribs 5-6; no obvious breast tissue tenderness  Skin:    General: Skin is warm and dry.     Coloration: Skin is not jaundiced.  Neurological:     Mental Status: She is alert.     ED Course/Procedures   Clinical Course as of Aug 21 1856  Sat Aug 22, 2019  1713 SARS Coronavirus 2 Ag: NEGATIVE [MV]    Clinical Course User Index [MV] Eustaquio Maize, PA-C    Procedures  Results for orders placed or performed during the hospital encounter of 08/22/19  SARS Coronavirus 2 Ag (30 min TAT) - Nasal Swab (BD Veritor Kit)   Specimen: Nasal Swab (BD Veritor Kit)  Result Value Ref Range   SARS Coronavirus 2 Ag NEGATIVE NEGATIVE  CBC with Differential  Result Value Ref Range   WBC 6.9 4.0 - 10.5 K/uL   RBC 4.53  3.87 - 5.11 MIL/uL   Hemoglobin 13.6 12.0 - 15.0 g/dL   HCT 39.5 36.0 - 46.0 %   MCV 87.2 80.0 - 100.0 fL   MCH 30.0 26.0 - 34.0 pg   MCHC 34.4 30.0 - 36.0 g/dL   RDW 12.5 11.5 - 15.5 %   Platelets 222 150 - 400 K/uL   nRBC 0.0 0.0 - 0.2 %   Neutrophils Relative % 39 %   Neutro Abs 2.7 1.7 - 7.7 K/uL   Lymphocytes Relative 48 %   Lymphs Abs 3.3 0.7 - 4.0 K/uL   Monocytes Relative 7 %   Monocytes Absolute 0.5 0.1 - 1.0 K/uL   Eosinophils Relative 5 %   Eosinophils Absolute 0.3 0.0 - 0.5 K/uL   Basophils Relative 1 %   Basophils Absolute 0.0 0.0 - 0.1 K/uL   Immature Granulocytes 0 %   Abs Immature Granulocytes 0.03 0.00 - 0.07 K/uL  Basic metabolic panel  Result Value Ref Range   Sodium 134 (L) 135 - 145 mmol/L   Potassium 4.1 3.5 - 5.1 mmol/L   Chloride 101 98 - 111 mmol/L   CO2 24 22 - 32 mmol/L   Glucose, Bld 303 (H) 70 - 99 mg/dL  BUN 9 6 - 20 mg/dL   Creatinine, Ser 0.73 0.44 - 1.00 mg/dL   Calcium 9.3 8.9 - 10.3 mg/dL   GFR calc non Af Amer >60 >60 mL/min   GFR calc Af Amer >60 >60 mL/min   Anion gap 9 5 - 15   CT Chest IMPRESSION:  1. No evidence of rib fracture or other acute bony abnormality. No  pneumothorax.  2. Few small bilateral pulmonary nodules, largest measuring 5 mm. No  routine follow-up needed if patient is low-risk (and has no known or  suspected primary neoplasm). Non-contrast chest CT can be considered  in 12 months if patient is high-risk. This recommendation follows  the consensus statement: Guidelines for Management of Incidental  Pulmonary Nodules Detected on CT Images: From the Fleischner Society  2017; Radiology 2017; 284:228-243.  3. Mild asymmetric increase in number of left axillary lymph nodes  compared to the right. The lymph nodes are not significantly  enlarged. Favored to be reactive.    MDM  CT chest without findings of rib fracture or PNA. On reeval pt resting comfortably; HR in the 80s; repeat temp 97.6. She has tenderness  directly underneath her breast along the chest wall near ribs 5-6. No crepitus. Suspect patient's pain is musculoskeletal in nature. Will discharge home with muscle relaxer and close PCP follow up.   Regarding pt's elevated temp in the ED - rapid COVID negative. Had discussed obtaining PCR test however pt does not want it at this time. I had lengthy discussion with patient regarding the fact that even though she has received both COVID vaccines we cannot definitively rule out COVID without the PCR test. She is in understanding and still does not wish to have it done. No obvious findings of PNA on xray but do not have obvious source for patient's fever. She denies urinary symptoms at this time and unable to provide urine sample for [place] in the ED. Will discharge home with Zpack in case her fevers continue. Have advised that she try muscle relaxer along with her typical narcotic pain medication prescribed by her PCP (although no findings of this on PDMP) and if pain/fevers continue to then start the antibiotic. Pt is in understanding of this plan.   She is to follow up with her PCP on Monday for reeval in the next 1-2 days. Strict return precautions have been discussed with her including worsening pain, shortness of breath, continuation of fevers despite abx, N/V, diaphoresis, or any other concerning symptoms. Pt is in agreement with plan and stable for discharge home.   This note was prepared using Dragon voice recognition software and may include unintentional dictation errors due to the inherent limitations of voice recognition software.    Discharge Instructions     Please call your PCP to schedule an appointment to be seen next week  Take the muscle relaxer as prescribed in addition to your usual pain  medication to see if this helps with your pain. DO NOT DRIVE WHILE ON THIS MEDICATION AS IT CAN MAKE YOU DROWSY.   If no improvement in symptoms and you continue to have a cough/fevers > 100.4 please  pick up the antibiotic and take as prescribed however there were no signs of a pneumonia on your CT scan today   Your CT scan did show pulmonary nodules on both sides of your lungs. It is recommended that you get a repeat CT scan in 12 months for reevaluation. Your PCP can schedule this for you.  Return to the ED for any worsening symptoms including worsening pain, shortness of breath, nausea, vomiting, profuse sweating, continuation of fevers > 100.4 despite antibiotics.   You have declined repeat COVID test today. Please understand we cannot definitively rule out COVID without this additional test even though you are fully vaccinated.        Eustaquio Maize, PA-C 08/22/19 Einar Crow    Isla Pence, MD 08/22/19 2005

## 2020-09-09 IMAGING — CT CT CHEST W/O CM
2 of 3 series · 15 of 36 positions shown, 18 images · non-contrast
Comparison: None.

CLINICAL DATA: Pt was cleaning and bent over Tammara Chong of
couch; having right lower rib pain; pt has h/o asthma; non-smoker;
pain with sitting up and laying down

EXAM:
CT CHEST WITHOUT CONTRAST
TECHNIQUE: Multidetector CT imaging of the chest was performed following the
standard protocol without IV contrast.

[Series 2: thorax · axial · 0.93mm/px · z∈[-319,-43]mm · 12 of 162 slices shown, 15 images]
[im 12/162  mediastinal]
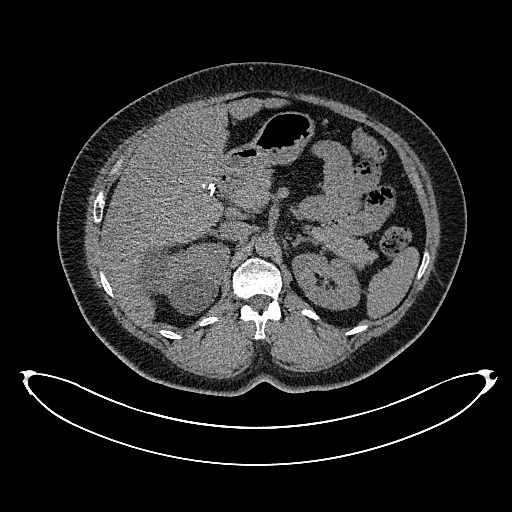
[im 12/162  lung]
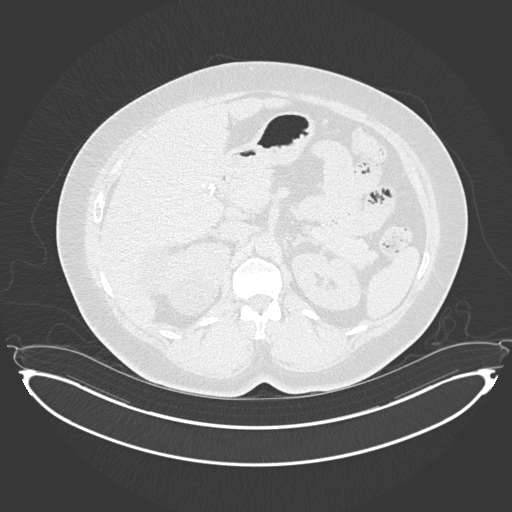
[im 24/162  lung]
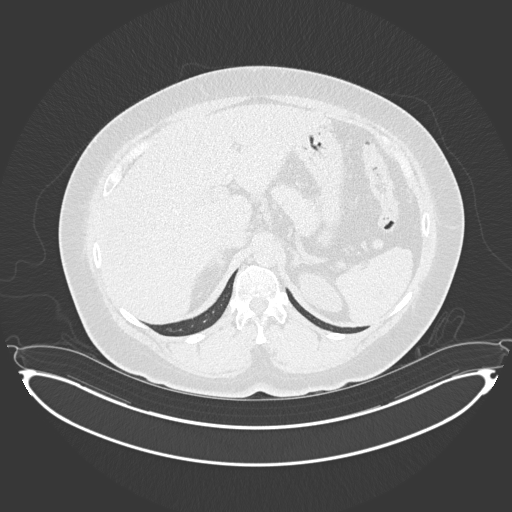
[im 36/162  lung]
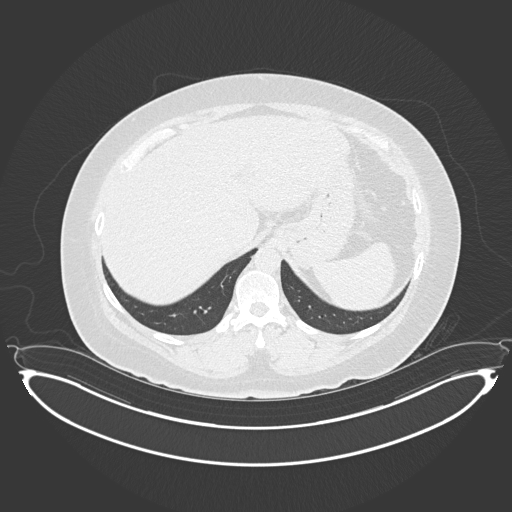
[im 48/162  lung]
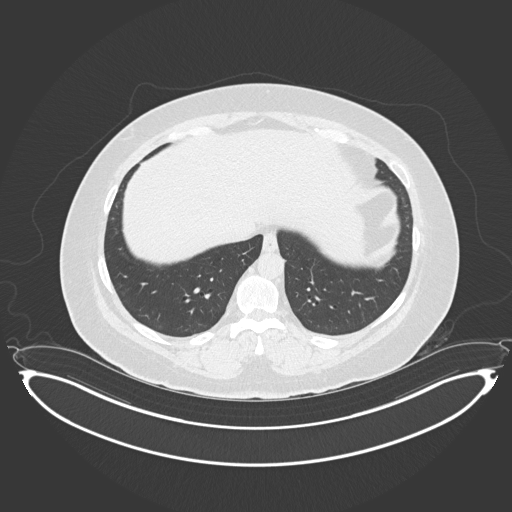
[im 60/162  mediastinal]
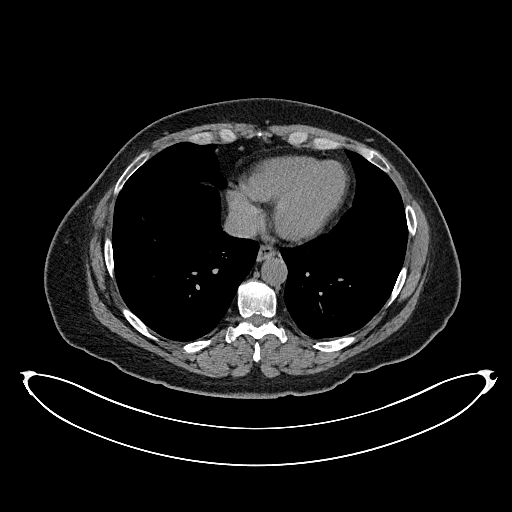
[im 60/162  lung]
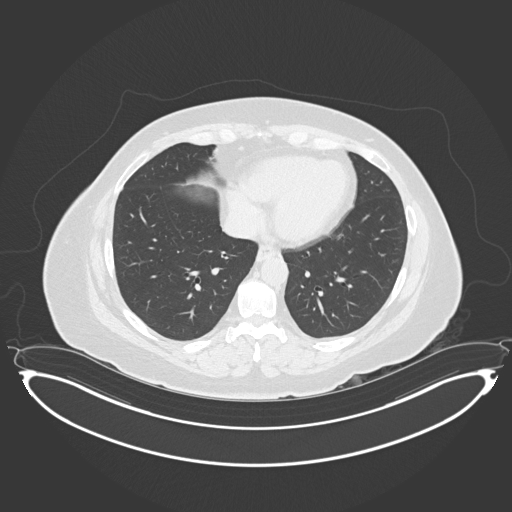
[im 72/162  lung]
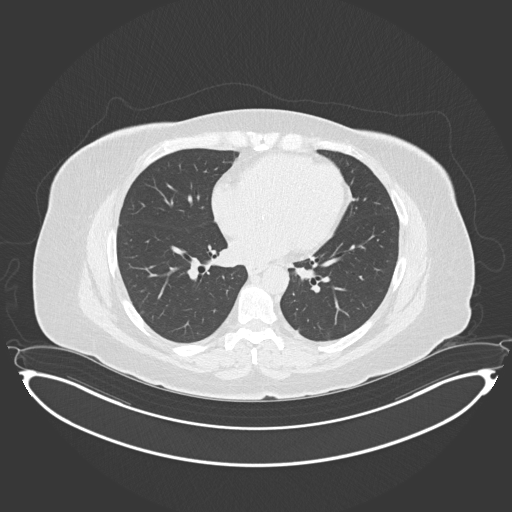
[im 90/162  lung]
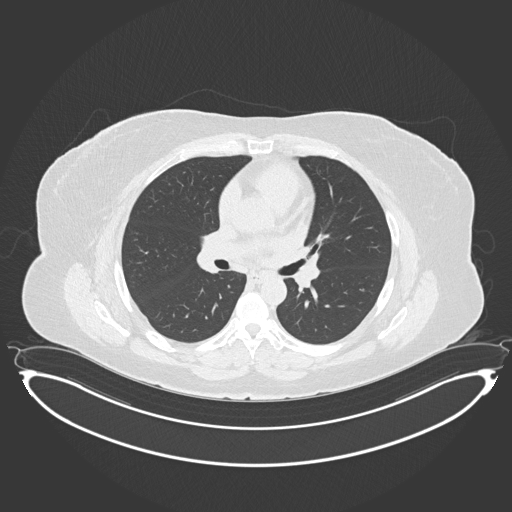
[im 102/162  lung]
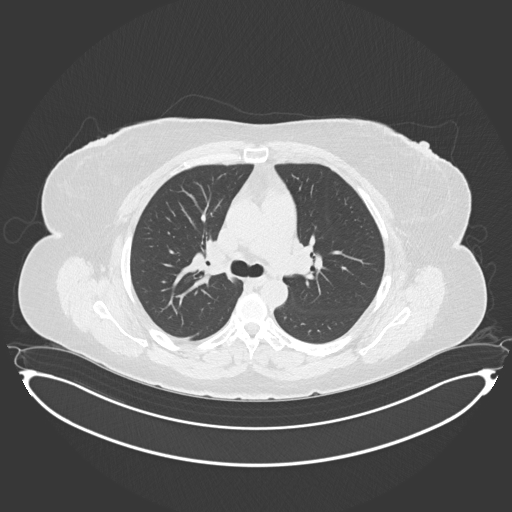
[im 114/162  mediastinal]
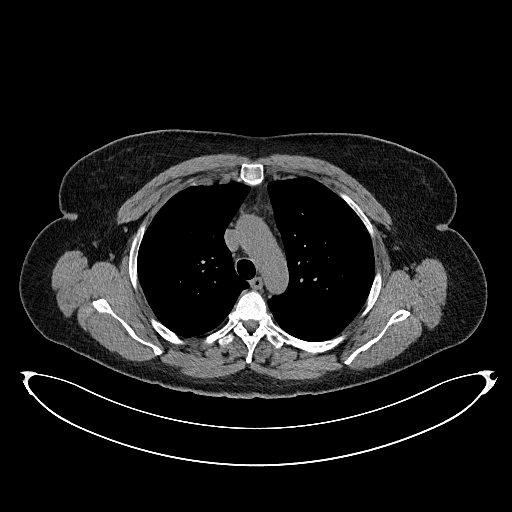
[im 114/162  lung]
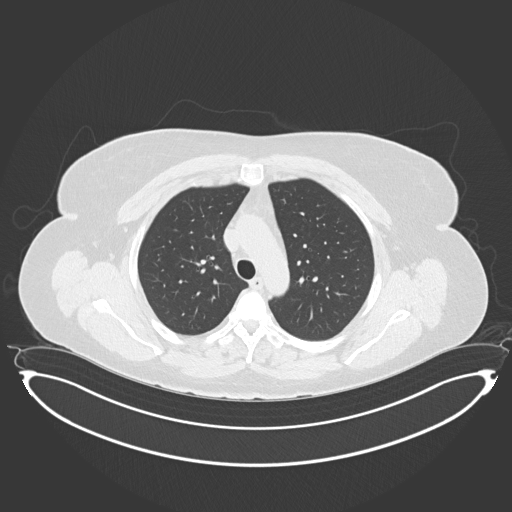
[im 126/162  lung]
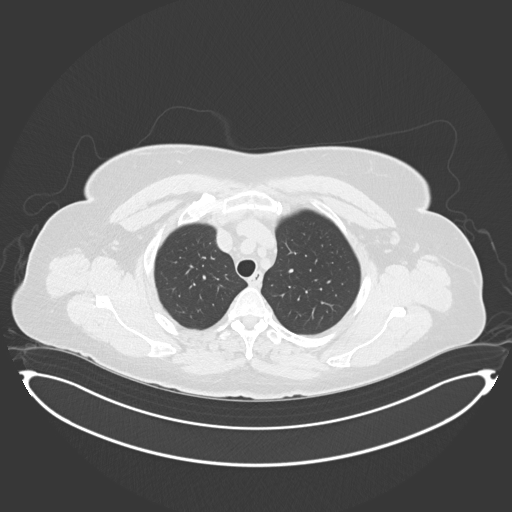
[im 138/162  lung]
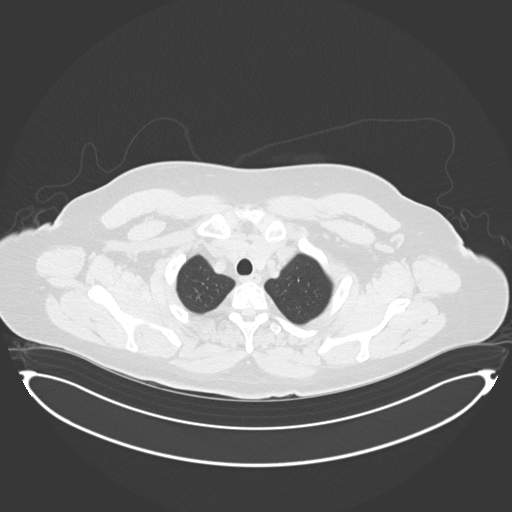
[im 150/162  lung]
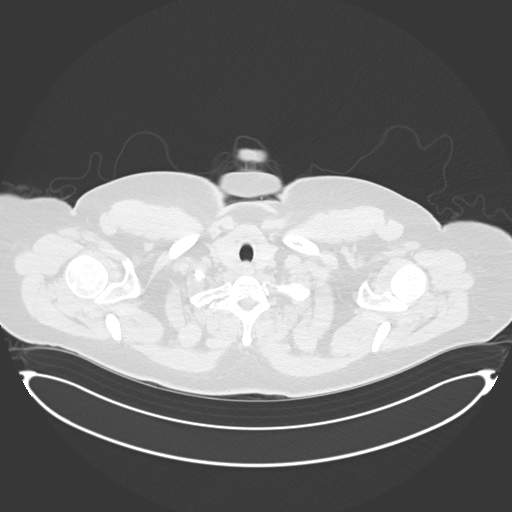

[Series 5: coronal · coronal · 0.66mm/px · 3 of 153 slices shown]
[im 31/153  lung]
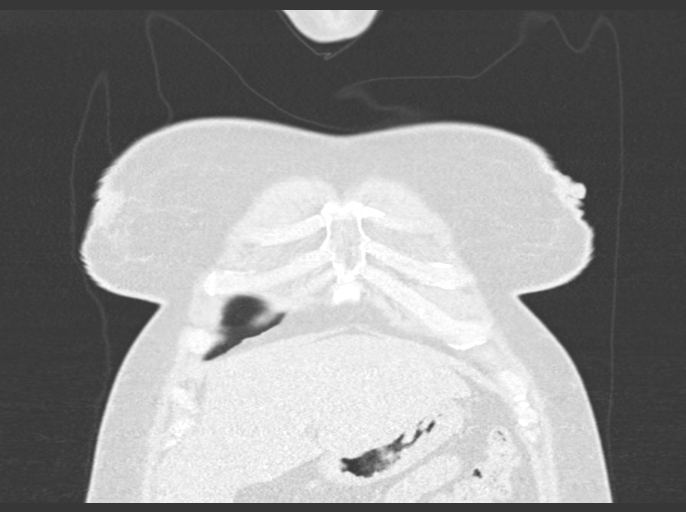
[im 61/153  lung]
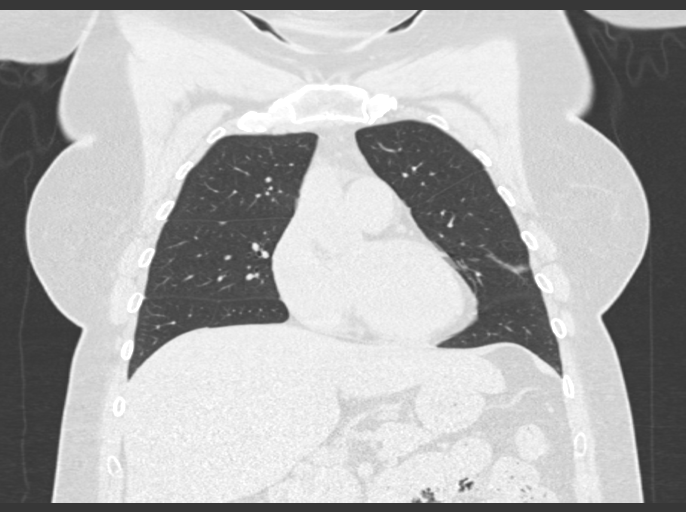
[im 92/153  lung]
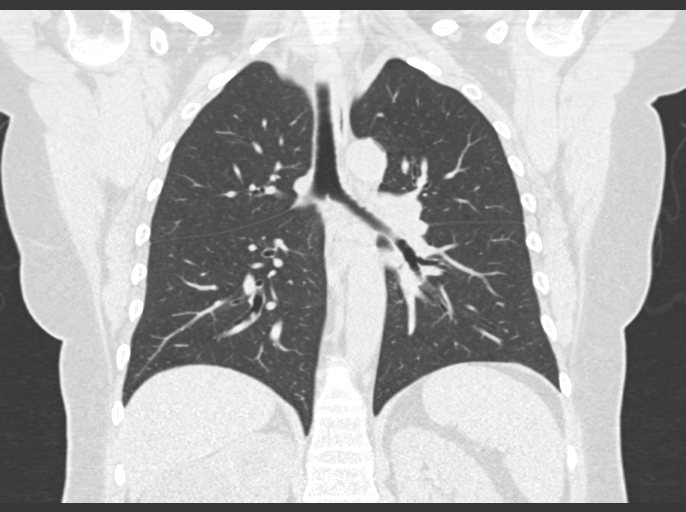

[15 of 36 positions shown; findings below may reference images not displayed]

FINDINGS: Cardiovascular: No significant vascular findings. Normal heart size.
No pericardial effusion.

Mediastinum/Nodes: The left axillary lymph nodes are mildly
increased compared to the right though not significantly enlarged.
No enlarged mediastinal nodes. Thyroid gland, trachea, and esophagus
demonstrate no significant findings.

Lungs/Pleura: Central airways are patent. There are a few small
bilateral pulmonary nodules, largest on the right in the lower lobe
is a subpleural solid nodule measuring 5 mm (series 2, image 89).
Largest on the left in the left lower lobe measures 4 mm (series 2,
image 80). Small bandlike opacity in the lingula likely reflects
atelectasis or scarring.

Upper Abdomen: Large right renal cyst. Small left renal cyst. Status
post cholecystectomy. Otherwise upper abdomen unremarkable.

Musculoskeletal: No evidence of a rib fracture or other acute bony
abnormality.
IMPRESSION: 1. No evidence of rib fracture or other acute bony abnormality. No
pneumothorax.
2. Few small bilateral pulmonary nodules, largest measuring 5 mm. No
routine follow-up needed if patient is low-risk (and has no known or
suspected primary neoplasm). Non-contrast chest CT can be considered
in 12 months if patient is high-risk. This recommendation follows
the consensus statement: Guidelines for Management of Incidental
Pulmonary Nodules Detected on CT Images: From the [HOSPITAL]
3. Mild asymmetric increase in number of left axillary lymph nodes
compared to the right. The lymph nodes are not significantly
enlarged. Favored to be reactive.

## 2021-09-30 ENCOUNTER — Other Ambulatory Visit: Payer: Self-pay

## 2021-09-30 ENCOUNTER — Emergency Department (HOSPITAL_BASED_OUTPATIENT_CLINIC_OR_DEPARTMENT_OTHER)
Admission: EM | Admit: 2021-09-30 | Discharge: 2021-09-30 | Disposition: A | Discharge status: Home / Self Care | Payer: Medicare HMO | Attending: Emergency Medicine | Admitting: Emergency Medicine

## 2021-09-30 ENCOUNTER — Emergency Department (HOSPITAL_BASED_OUTPATIENT_CLINIC_OR_DEPARTMENT_OTHER): Payer: Medicare HMO

## 2021-09-30 ENCOUNTER — Encounter (HOSPITAL_BASED_OUTPATIENT_CLINIC_OR_DEPARTMENT_OTHER): Payer: Self-pay | Admitting: Emergency Medicine

## 2021-09-30 DIAGNOSIS — Z20822 Contact with and (suspected) exposure to covid-19: Secondary | ICD-10-CM | POA: Diagnosis not present

## 2021-09-30 DIAGNOSIS — J45909 Unspecified asthma, uncomplicated: Secondary | ICD-10-CM | POA: Diagnosis not present

## 2021-09-30 DIAGNOSIS — Z794 Long term (current) use of insulin: Secondary | ICD-10-CM | POA: Diagnosis not present

## 2021-09-30 DIAGNOSIS — Z7984 Long term (current) use of oral hypoglycemic drugs: Secondary | ICD-10-CM | POA: Insufficient documentation

## 2021-09-30 DIAGNOSIS — R059 Cough, unspecified: Secondary | ICD-10-CM | POA: Diagnosis present

## 2021-09-30 DIAGNOSIS — E119 Type 2 diabetes mellitus without complications: Secondary | ICD-10-CM | POA: Insufficient documentation

## 2021-09-30 DIAGNOSIS — J209 Acute bronchitis, unspecified: Secondary | ICD-10-CM | POA: Insufficient documentation

## 2021-09-30 LAB — RESP PANEL BY RT-PCR (FLU A&B, COVID) ARPGX2
Influenza A by PCR: NEGATIVE
Influenza B by PCR: NEGATIVE
SARS Coronavirus 2 by RT PCR: NEGATIVE

## 2021-09-30 MED ORDER — ALBUTEROL SULFATE HFA 108 (90 BASE) MCG/ACT IN AERS
2.0000 | INHALATION_SPRAY | Freq: Once | RESPIRATORY_TRACT | Status: AC
  Administered 2021-09-30: 2 via RESPIRATORY_TRACT
  Filled 2021-09-30: qty 6.7

## 2021-09-30 MED ORDER — PREDNISONE 20 MG PO TABS: 40.0000 mg | ORAL_TABLET | Freq: Every day | ORAL | 0 refills | Status: AC

## 2021-09-30 MED ORDER — HYDROCOD POLI-CHLORPHE POLI ER 10-8 MG/5ML PO SUER
5.0000 mL | Freq: Once | ORAL | Status: AC
  Administered 2021-09-30: 5 mL via ORAL
  Filled 2021-09-30: qty 5

## 2021-09-30 MED ORDER — DOXYCYCLINE HYCLATE 100 MG PO CAPS: 100.0000 mg | ORAL_CAPSULE | Freq: Two times a day (BID) | ORAL | 0 refills | Status: DC

## 2021-09-30 NOTE — Discharge Instructions (Addendum)
Take the antibiotics that were previously prescribed by the other provider, Augmentin.  Recommend taking the course of steroids as newly prescribed.  Recommend using the inhaler as needed for wheezing.  Patient should come back to ER if you develop difficulty breathing, chest pain, fever or other new concerning symptom. ?

## 2021-09-30 NOTE — ED Notes (Signed)
Patient Alert and oriented to baseline. Stable and ambulatory to baseline. Patient verbalized understanding of the discharge instructions.  Patient belongings were taken by the patient.   

## 2021-09-30 NOTE — ED Triage Notes (Signed)
Pt arrives pov, steady gait c/o HA cough and head and chest congestion x 5 days. Recent tx by pcp, no improvement  ?

## 2021-09-30 NOTE — ED Provider Notes (Signed)
?[facility] EMERGENCY DEPARTMENT ?Provider Note ? ? ?CSN: [id number] ?Arrival date & time: 09/30/21  1130 ? ?  ? ?History ? ?Chief Complaint  ?Patient presents with  ? Cough  ? ? ?Karen Carter is a 56 y.o. female.  Presented to ER due to concern for cough.  For about 1 week has had cough, congestion, headache, malaise.  States that she went to her primary care doctor.  Given Rx for antibiotic, but has not noticed any improvement.  No difficulty breathing right now but has periodically felt short of breath and had periodic wheezing.  Does endorse prior history of asthma.  States that she thinks her inhaler is out of date. ? ?HPI ? ?  ? ?Home Medications ?Prior to Admission medications   ?Medication Sig Start Date End Date Taking? Authorizing Provider  ?predniSONE (DELTASONE) 20 MG tablet Take 2 tablets (40 mg total) by mouth daily for 5 days. 09/30/21 10/05/21 Yes Milagros Loll, MD  ?amoxicillin-clavulanate (AUGMENTIN) 875-125 MG tablet Take 1 tablet by mouth every 12 (twelve) hours. 09/29/15   Joycie Peek, PA-C  ?ATORVASTATIN CALCIUM PO Take by mouth.    [provider]  ?azithromycin (ZITHROMAX) 250 MG tablet Take 1 tablet (250 mg total) by mouth daily. Take first 2 tablets together, then 1 every day until finished. 08/22/19   Tanda Rockers, PA-C  ?dicyclomine (BENTYL) 10 MG capsule Take 10 mg by mouth 2 (two) times daily as needed.    [provider]  ?etodolac (LODINE) 300 MG capsule Take 1 capsule (300 mg total) by mouth every 8 (eight) hours. 10/26/12   Linwood Dibbles, MD  ?Exenatide ER (BYDUREON) 2 MG PEN Inject into the skin.    [provider]  ?HYDROcodone-acetaminophen (LORTAB) 10-500 MG per tablet Take 1 tablet by mouth every 6 (six) hours as needed.    [provider]  ?insulin aspart (NOVOLOG) 100 UNIT/ML injection Inject into the skin 3 (three) times daily before meals.    [provider]  ?Insulin Detemir (LEVEMIR [place]) Inject into the skin.     [provider]  ?lisinopril (PRINIVIL,ZESTRIL) 10 MG tablet Take 10 mg by mouth daily.    [provider]  ?lithium 300 MG tablet Take 300 mg by mouth.    [provider]  ?metFORMIN (GLUCOPHAGE) 1000 MG tablet Take 1,000 mg by mouth 2 (two) times daily with a meal.    [provider]  ?methocarbamol (ROBAXIN) 500 MG tablet Take 1 tablet (500 mg total) by mouth 2 (two) times daily. 08/22/19   Tanda Rockers, PA-C  ?risperiDONE (RISPERDAL) 1 MG tablet Take 1 mg by mouth at bedtime.    [provider]  ?Semaglutide (OZEMPIC, 1 MG/DOSE, [place]) Inject into the skin.    [provider]  ?   ? ?Allergies    ?Hydrocodone-acetaminophen and Sulfa antibiotics   ? ?Review of Systems   ?Review of Systems  ?Constitutional:  Negative for chills and fever.  ?HENT:  Negative for ear pain and sore throat.   ?Eyes:  Negative for pain and visual disturbance.  ?Respiratory:  Positive for cough and shortness of breath.   ?Cardiovascular:  Negative for chest pain and palpitations.  ?Gastrointestinal:  Negative for abdominal pain and vomiting.  ?Genitourinary:  Negative for dysuria and hematuria.  ?Musculoskeletal:  Negative for arthralgias and back pain.  ?Skin:  Negative for color change and rash.  ?Neurological:  Negative for seizures and syncope.  ?All other systems reviewed and are  negative. ? ?Physical Exam ?Updated Vital Signs ?BP 107/75   Pulse 96   Temp 98.2 ?F (36.8 ?C)   Resp 17   Ht 5' 5.5" (1.664 m)   Wt 89.8 kg   SpO2 96%   BMI 32.45 kg/m?  ?Physical Exam ?Vitals and nursing note reviewed.  ?Constitutional:   ?   General: She is not in acute distress. ?   Appearance: She is well-developed.  ?HENT:  ?   Head: Normocephalic and atraumatic.  ?Eyes:  ?   Conjunctiva/sclera: Conjunctivae normal.  ?Cardiovascular:  ?   Rate and Rhythm: Normal rate and regular rhythm.  ?   Heart sounds: No murmur heard. ?Pulmonary:  ?   Effort: Pulmonary effort is normal. No respiratory  distress.  ?   Comments: Slight end expiratory wheeze, speaks in full sentences, no distress ?Abdominal:  ?   Palpations: Abdomen is soft.  ?   Tenderness: There is no abdominal tenderness.  ?Musculoskeletal:     ?   General: No swelling.  ?   Cervical back: Neck supple.  ?Skin: ?   General: Skin is warm and dry.  ?   Capillary Refill: Capillary refill takes less than 2 seconds.  ?Neurological:  ?   Mental Status: She is alert.  ?Psychiatric:     ?   Mood and Affect: Mood normal.  ? ? ?ED Results / Procedures / Treatments   ?Labs ?(all labs ordered are listed, but only abnormal results are displayed) ?Labs Reviewed  ?RESP PANEL BY RT-PCR (FLU A&B, COVID) ARPGX2  ? ? ?EKG ?None ? ?Radiology ?DG Chest 2 View ? ?Result Date: 09/30/2021 ?CLINICAL DATA:  Cough, shortness of breath and congestion for 1 week, history of asthma and diabetes mellitus EXAM: CHEST - 2 VIEW COMPARISON:  12/09/2020 FINDINGS: Normal heart size, mediastinal contours, and pulmonary vascularity. Mild central peribronchial thickening which could reflect bronchitis or asthma. No acute infiltrate, pleural effusion or pneumothorax. No acute osseous findings. IMPRESSION: Peribronchial thickening question bronchitis versus asthma. No acute infiltrate. Electronically Signed   By: Ulyses Southward M.D.   On: 09/30/2021 12:37   ? ?Procedures ?Procedures  ? ? ?Medications Ordered in ED ?Medications  ?chlorpheniramine-HYDROcodone 10-8 MG/5ML suspension 5 mL (5 mLs Oral Given 09/30/21 1336)  ?albuterol (VENTOLIN HFA) 108 (90 Base) MCG/ACT inhaler 2 puff (2 puffs Inhalation Given 09/30/21 1336)  ? ? ?ED Course/ Medical Decision Making/ A&P ?  ?                        ?Medical Decision Making ?Amount and/or Complexity of Data Reviewed ?Radiology: ordered. ? ?Risk ?Prescription drug management. ? ? ?56 year old lady presented to ER due to concern for cough, congestion.  She does have history of asthma and had slight wheeze on exam.  Gave patient albuterol treatment here,  cough suppressant dose.  Symptoms are improving.  Her vitals are all stable, no hypoxia or tachypnea.  COVID and flu testing negative.  CXR negative for pneumonia.  Per review of chart, primary care provider provided Rx for Augmentin.  Recommend patient complete this course of antibiotics for possible bacterial etiology for symptoms.  I additionally advised starting a course of steroids due to concern for possible reactive airway disease exacerbation.  Reviewed return precautions, discharged. ? ? ? ?After the discussed management above, the patient was determined to be safe for discharge.  The patient was in agreement with this plan and all questions regarding their care were answered.  ED return precautions were discussed and the patient will return to the ED with any significant worsening of condition. ? ? ? ? ? ? ? ? ?Final Clinical Impression(s) / ED Diagnoses ?Final diagnoses:  ?Acute bronchitis, unspecified organism  ? ? ?Rx / DC Orders ?ED Discharge Orders   ? ?      Ordered  ?  doxycycline (VIBRAMYCIN) 100 MG capsule  2 times daily,   Status:  Discontinued       ? 09/30/21 1417  ?  predniSONE (DELTASONE) 20 MG tablet  Daily       ? 09/30/21 1417  ? ?  ?  ? ?  ? ? ?  ?Milagros Lollykstra, Caitlynn Ju S, MD ?10/01/21 0715 ? ?
